# Patient Record
Sex: Male | Born: 1965 | Race: White | Hispanic: No | Marital: Single | Smoking: Heavy tobacco smoker
Health system: Southern US, Community
[De-identification: ages and names within clinical notes are randomized; demographics above are authoritative.]

## PROBLEM LIST (undated history)

## (undated) DIAGNOSIS — I10 Essential (primary) hypertension: Secondary | ICD-10-CM

## (undated) HISTORY — PX: OTHER SURGICAL HISTORY: SHX169

## (undated) HISTORY — PX: MOUTH SURGERY: SHX715

---

## 2007-10-10 ENCOUNTER — Emergency Department (HOSPITAL_COMMUNITY): Admission: EM | Admit: 2007-10-10 | Discharge: 2007-10-10 | Payer: Self-pay | Admitting: Emergency Medicine

## 2009-03-15 ENCOUNTER — Emergency Department (HOSPITAL_COMMUNITY): Admission: EM | Admit: 2009-03-15 | Discharge: 2009-03-15 | Payer: Self-pay | Admitting: *Deleted

## 2010-10-27 ENCOUNTER — Emergency Department (HOSPITAL_COMMUNITY)
Admission: EM | Admit: 2010-10-27 | Discharge: 2010-10-27 | Payer: Self-pay | Source: Home / Self Care | Admitting: Emergency Medicine

## 2010-10-31 LAB — COMPREHENSIVE METABOLIC PANEL
ALT: 18 U/L (ref 0–53)
AST: 26 U/L (ref 0–37)
Albumin: 3.8 g/dL (ref 3.5–5.2)
CO2: 23 mEq/L (ref 19–32)
Calcium: 9.4 mg/dL (ref 8.4–10.5)
Chloride: 102 mEq/L (ref 96–112)
GFR calc Af Amer: >60 mL/min (ref >60)
GFR calc non Af Amer: >60 mL/min (ref >60)
Sodium: 136 mEq/L (ref 135–145)
Total Bilirubin: 0.7 mg/dL (ref 0.3–1.2)

## 2010-10-31 LAB — DIFFERENTIAL
Lymphocytes Relative: 12 % (ref 12–46)
Lymphs Abs: 2 10*3/uL (ref 0.7–4.0)
Monocytes Absolute: 1 10*3/uL (ref 0.1–1.0)
Monocytes Relative: 6 % (ref 3–12)
Neutro Abs: 13.4 10*3/uL — ABNORMAL HIGH (ref 1.7–7.7)

## 2010-10-31 LAB — CBC
HCT: 44 % (ref 39.0–52.0)
Hemoglobin: 14.3 g/dL (ref 13.0–17.0)
MCH: 29.1 pg (ref 26.0–34.0)
MCHC: 32.5 g/dL (ref 30.0–36.0)

## 2010-10-31 LAB — POCT CARDIAC MARKERS: Troponin i, poc: <0.05 ng/mL (ref 0.00–0.09)

## 2011-01-16 LAB — RAPID URINE DRUG SCREEN, HOSP PERFORMED
Amphetamines: NOT DETECTED
Cocaine: NOT DETECTED
Opiates: NOT DETECTED
Tetrahydrocannabinol: NOT DETECTED

## 2011-01-16 LAB — POCT I-STAT, CHEM 8
BUN: 13 mg/dL (ref 6–23)
Creatinine, Ser: 1.2 mg/dL (ref 0.4–1.5)
Glucose, Bld: 109 mg/dL — ABNORMAL HIGH (ref 70–99)
Hemoglobin: 14.6 g/dL (ref 13.0–17.0)
Potassium: 3.5 mEq/L (ref 3.5–5.1)

## 2014-09-27 ENCOUNTER — Emergency Department (HOSPITAL_COMMUNITY)
Admission: EM | Admit: 2014-09-27 | Discharge: 2014-09-27 | Disposition: A | Discharge status: Home / Self Care | Payer: Medicaid Other | Attending: Emergency Medicine | Admitting: Emergency Medicine

## 2014-09-27 ENCOUNTER — Emergency Department (HOSPITAL_COMMUNITY): Payer: Medicaid Other

## 2014-09-27 ENCOUNTER — Encounter (HOSPITAL_COMMUNITY): Payer: Self-pay | Admitting: Emergency Medicine

## 2014-09-27 DIAGNOSIS — R000 Tachycardia, unspecified: Secondary | ICD-10-CM | POA: Insufficient documentation

## 2014-09-27 DIAGNOSIS — Y9389 Activity, other specified: Secondary | ICD-10-CM | POA: Diagnosis not present

## 2014-09-27 DIAGNOSIS — S2232XA Fracture of one rib, left side, initial encounter for closed fracture: Secondary | ICD-10-CM

## 2014-09-27 DIAGNOSIS — Y998 Other external cause status: Secondary | ICD-10-CM | POA: Insufficient documentation

## 2014-09-27 DIAGNOSIS — Y9289 Other specified places as the place of occurrence of the external cause: Secondary | ICD-10-CM | POA: Insufficient documentation

## 2014-09-27 DIAGNOSIS — S2242XA Multiple fractures of ribs, left side, initial encounter for closed fracture: Secondary | ICD-10-CM | POA: Diagnosis not present

## 2014-09-27 DIAGNOSIS — S29001A Unspecified injury of muscle and tendon of front wall of thorax, initial encounter: Secondary | ICD-10-CM | POA: Diagnosis present

## 2014-09-27 HISTORY — DX: Essential (primary) hypertension: I10

## 2014-09-27 MED ORDER — IBUPROFEN 800 MG PO TABS: 800.0000 mg | ORAL_TABLET | Freq: Three times a day (TID) | ORAL | Status: AC

## 2014-09-27 MED ORDER — HYDROCODONE-ACETAMINOPHEN 5-325 MG PO TABS: 1.0000 | ORAL_TABLET | Freq: Four times a day (QID) | ORAL | Status: DC | PRN

## 2014-09-27 MED ORDER — IBUPROFEN 800 MG PO TABS
800.0000 mg | ORAL_TABLET | Freq: Once | ORAL | Status: AC
  Administered 2014-09-27: 800 mg via ORAL
  Filled 2014-09-27: qty 1

## 2014-09-27 MED ORDER — HYDROCODONE-ACETAMINOPHEN 5-325 MG PO TABS
1.0000 | ORAL_TABLET | Freq: Once | ORAL | Status: AC
Start: 2014-09-27 — End: 2014-09-27
  Administered 2014-09-27: 1 via ORAL
  Filled 2014-09-27: qty 1

## 2014-09-27 NOTE — ED Notes (Signed)
To x-ray
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
## 2014-09-27 NOTE — ED Notes (Signed)
Discharge instructions given.  Education provided on using incentive spirometer.  Able to demonstrate use.

## 2014-10-04 ENCOUNTER — Emergency Department (HOSPITAL_COMMUNITY)
Admission: EM | Admit: 2014-10-04 | Discharge: 2014-10-04 | Disposition: A | Discharge status: Home / Self Care | Payer: Medicaid Other | Attending: Emergency Medicine | Admitting: Emergency Medicine

## 2014-10-04 ENCOUNTER — Emergency Department (HOSPITAL_COMMUNITY): Payer: Medicaid Other

## 2014-10-04 ENCOUNTER — Encounter (HOSPITAL_COMMUNITY): Payer: Self-pay

## 2014-10-04 DIAGNOSIS — Z791 Long term (current) use of non-steroidal anti-inflammatories (NSAID): Secondary | ICD-10-CM | POA: Insufficient documentation

## 2014-10-04 DIAGNOSIS — I10 Essential (primary) hypertension: Secondary | ICD-10-CM | POA: Insufficient documentation

## 2014-10-04 DIAGNOSIS — S2242XG Multiple fractures of ribs, left side, subsequent encounter for fracture with delayed healing: Secondary | ICD-10-CM | POA: Diagnosis not present

## 2014-10-04 DIAGNOSIS — R05 Cough: Secondary | ICD-10-CM | POA: Diagnosis not present

## 2014-10-04 DIAGNOSIS — Z720 Tobacco use: Secondary | ICD-10-CM | POA: Insufficient documentation

## 2014-10-04 DIAGNOSIS — M549 Dorsalgia, unspecified: Secondary | ICD-10-CM | POA: Insufficient documentation

## 2014-10-04 DIAGNOSIS — R1032 Left lower quadrant pain: Secondary | ICD-10-CM | POA: Insufficient documentation

## 2014-10-04 DIAGNOSIS — R079 Chest pain, unspecified: Secondary | ICD-10-CM | POA: Diagnosis present

## 2014-10-04 DIAGNOSIS — G8911 Acute pain due to trauma: Secondary | ICD-10-CM | POA: Diagnosis not present

## 2014-10-04 DIAGNOSIS — Z7982 Long term (current) use of aspirin: Secondary | ICD-10-CM | POA: Diagnosis not present

## 2014-10-04 DIAGNOSIS — M7981 Nontraumatic hematoma of soft tissue: Secondary | ICD-10-CM | POA: Insufficient documentation

## 2014-10-04 LAB — I-STAT CREATININE, ED: Creatinine, Ser: 1.1 mg/dL (ref 0.50–1.35)

## 2014-10-04 MED ORDER — IOHEXOL 300 MG/ML  SOLN
100.0000 mL | Freq: Once | INTRAMUSCULAR | Status: AC | PRN
Start: 2014-10-04 — End: 2014-10-04
  Administered 2014-10-04: 100 mL via INTRAVENOUS

## 2014-10-04 MED ORDER — ONDANSETRON HCL 4 MG/2ML IJ SOLN
4.0000 mg | Freq: Once | INTRAMUSCULAR | Status: AC
  Administered 2014-10-04: 4 mg via INTRAVENOUS
  Filled 2014-10-04: qty 2

## 2014-10-04 MED ORDER — HYDROMORPHONE HCL 1 MG/ML IJ SOLN
1.0000 mg | Freq: Once | INTRAMUSCULAR | Status: AC
Start: 2014-10-04 — End: 2014-10-04
  Administered 2014-10-04: 1 mg via INTRAVENOUS
  Filled 2014-10-04: qty 1

## 2014-10-04 MED ORDER — HYDROCODONE-ACETAMINOPHEN 5-325 MG PO TABS: 1.0000 | ORAL_TABLET | Freq: Four times a day (QID) | ORAL | Status: AC | PRN
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
## 2014-10-04 NOTE — Discharge Instructions (Signed)
You have 4 broken ribs on the left side.  Please follow instruction below for care.  Take pain medication as needed. Follow up with your primary care provider for further care.  Rib Fracture A rib fracture is a break or crack in one of the bones of the ribs. The ribs are a group of long, curved bones that wrap around your chest and attach to your spine. They protect your lungs and other organs in the chest cavity. A broken or cracked rib is often painful, but most do not cause other problems. Most rib fractures heal on their own over time. However, rib fractures can be more serious if multiple ribs are broken or if broken ribs move out of place and push against other structures. CAUSES   A direct blow to the chest. For example, this could happen during contact sports, a car accident, or a fall against a hard object.  Repetitive movements with high force, such as pitching a baseball or having severe coughing spells. SYMPTOMS   Pain when you breathe in or cough.  Pain when someone presses on the injured area. DIAGNOSIS  Your caregiver will perform a physical exam. Various imaging tests may be ordered to confirm the diagnosis and to look for related injuries. These tests may include a chest X-ray, computed tomography (CT), magnetic resonance imaging (MRI), or a bone scan. TREATMENT  Rib fractures usually heal on their own in 1-3 months. The longer healing period is often associated with a continued cough or other aggravating activities. During the healing period, pain control is very important. Medication is usually given to control pain. Hospitalization or surgery may be needed for more severe injuries, such as those in which multiple ribs are broken or the ribs have moved out of place.  HOME CARE INSTRUCTIONS   Avoid strenuous activity and any activities or movements that cause pain. Be careful during activities and avoid bumping the injured rib.  Gradually increase activity as directed by your  caregiver.  Only take over-the-counter or prescription medications as directed by your caregiver. Do not take other medications without asking your caregiver first.  Apply ice to the injured area for the first 1-2 days after you have been treated or as directed by your caregiver. Applying ice helps to reduce inflammation and pain.  Put ice in a plastic bag.  Place a towel between your skin and the bag.   Leave the ice on for 15-20 minutes at a time, every 2 hours while you are awake.  Perform deep breathing as directed by your caregiver. This will help prevent pneumonia, which is a common complication of a broken rib. Your caregiver may instruct you to:  Take deep breaths several times a day.  Try to cough several times a day, holding a pillow against the injured area.  Use a device called an incentive spirometer to practice deep breathing several times a day.  Drink enough fluids to keep your urine clear or pale yellow. This will help you avoid constipation.   Do not wear a rib belt or binder. These restrict breathing, which can lead to pneumonia.  SEEK IMMEDIATE MEDICAL CARE IF:   You have a fever.   You have difficulty breathing or shortness of breath.   You develop a continual cough, or you cough up thick or bloody sputum.  You feel sick to your stomach (nausea), throw up (vomit), or have abdominal pain.   You have worsening pain not controlled with medications.  MAKE SURE  YOU:  Understand these instructions.  Will watch your condition.  Will get help right away if you are not doing well or get worse. Document Released: 09/25/2005 Document Revised: 05/28/2013 Document Reviewed: 11/27/2012 [facility] Patient Information 2015 [place], [place]. This information is not intended to replace advice given to you by your health care provider. Make sure you discuss any questions you have with your health care provider.

## 2014-10-08 ENCOUNTER — Emergency Department (HOSPITAL_COMMUNITY): Payer: Medicaid Other

## 2014-10-08 ENCOUNTER — Encounter (HOSPITAL_COMMUNITY): Payer: Self-pay | Admitting: Emergency Medicine

## 2014-10-08 ENCOUNTER — Emergency Department (HOSPITAL_COMMUNITY)
Admission: EM | Admit: 2014-10-08 | Discharge: 2014-10-08 | Disposition: A | Discharge status: Home / Self Care | Payer: Medicaid Other | Attending: Emergency Medicine | Admitting: Emergency Medicine

## 2014-10-08 DIAGNOSIS — R0781 Pleurodynia: Secondary | ICD-10-CM | POA: Diagnosis not present

## 2014-10-08 DIAGNOSIS — Z720 Tobacco use: Secondary | ICD-10-CM | POA: Diagnosis not present

## 2014-10-08 DIAGNOSIS — Z8781 Personal history of (healed) traumatic fracture: Secondary | ICD-10-CM | POA: Diagnosis not present

## 2014-10-08 DIAGNOSIS — Z791 Long term (current) use of non-steroidal anti-inflammatories (NSAID): Secondary | ICD-10-CM | POA: Diagnosis not present

## 2014-10-08 DIAGNOSIS — I10 Essential (primary) hypertension: Secondary | ICD-10-CM | POA: Insufficient documentation

## 2014-10-08 DIAGNOSIS — G8911 Acute pain due to trauma: Secondary | ICD-10-CM | POA: Diagnosis not present

## 2014-10-08 MED ORDER — OXYCODONE-ACETAMINOPHEN 5-325 MG PO TABS
2.0000 | ORAL_TABLET | Freq: Once | ORAL | Status: AC
  Administered 2014-10-08: 2 via ORAL
  Filled 2014-10-08: qty 2

## 2014-10-08 MED ORDER — KETOROLAC TROMETHAMINE 60 MG/2ML IM SOLN
60.0000 mg | Freq: Once | INTRAMUSCULAR | Status: AC
  Administered 2014-10-08: 60 mg via INTRAMUSCULAR
  Filled 2014-10-08: qty 2

## 2014-10-08 MED ORDER — OXYCODONE-ACETAMINOPHEN 5-325 MG PO TABS: 1.0000 | ORAL_TABLET | Freq: Four times a day (QID) | ORAL | Status: AC | PRN
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
## 2014-10-08 NOTE — Discharge Instructions (Signed)

## 2014-10-10 ENCOUNTER — Encounter (HOSPITAL_COMMUNITY): Payer: Self-pay | Admitting: Emergency Medicine

## 2014-10-10 ENCOUNTER — Emergency Department (HOSPITAL_COMMUNITY)
Admission: EM | Admit: 2014-10-10 | Discharge: 2014-10-10 | Disposition: A | Discharge status: Home / Self Care | Payer: Medicaid Other | Attending: Emergency Medicine | Admitting: Emergency Medicine

## 2014-10-10 DIAGNOSIS — Z7982 Long term (current) use of aspirin: Secondary | ICD-10-CM | POA: Insufficient documentation

## 2014-10-10 DIAGNOSIS — R21 Rash and other nonspecific skin eruption: Secondary | ICD-10-CM

## 2014-10-10 DIAGNOSIS — Z791 Long term (current) use of non-steroidal anti-inflammatories (NSAID): Secondary | ICD-10-CM | POA: Insufficient documentation

## 2014-10-10 DIAGNOSIS — Z720 Tobacco use: Secondary | ICD-10-CM | POA: Insufficient documentation

## 2014-10-10 DIAGNOSIS — I10 Essential (primary) hypertension: Secondary | ICD-10-CM | POA: Insufficient documentation

## 2014-10-10 DIAGNOSIS — M79671 Pain in right foot: Secondary | ICD-10-CM | POA: Diagnosis not present

## 2014-10-10 MED ORDER — TERBINAFINE HCL 1 % EX CREA: 1.0000 "application " | TOPICAL_CREAM | Freq: Two times a day (BID) | CUTANEOUS | Status: AC

## 2014-10-10 MED ORDER — HYDROCODONE-ACETAMINOPHEN 7.5-325 MG/15ML PO SOLN: 15.0000 mL | ORAL | Status: DC | PRN

## 2014-10-10 NOTE — Discharge Instructions (Signed)
It is important if you to follow-up with primary care or call health and wellness for further evaluation and management of your symptoms. Please take your medicine as directed for any discomfort you are may experience. He will also need to be sure to keep your feet dry, change your socks and keep your boots dry. You will need to return to the ED for further evaluation if you experience any redness or swelling to the bottoms of your feet, drainage or bleeding or other signs of infection.

## 2014-10-10 NOTE — ED Notes (Signed)
Declined W/C at D/C and was escorted to lobby by RN. 
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
